# Patient Record
Sex: Male | Born: 1950 | Race: White | Hispanic: No | Marital: Married | Smoking: Never smoker
Health system: Southern US, Community
[De-identification: ages and names within clinical notes are randomized; demographics above are authoritative.]

## PROBLEM LIST (undated history)

## (undated) HISTORY — PX: APPENDECTOMY: SHX54

## (undated) HISTORY — PX: COLOSTOMY: SHX63

## (undated) HISTORY — PX: NEPHRECTOMY: SHX65

## (undated) HISTORY — PX: HERNIA REPAIR: SHX51

---

## 2014-06-24 ENCOUNTER — Emergency Department (HOSPITAL_COMMUNITY)
Admission: EM | Admit: 2014-06-24 | Discharge: 2014-06-24 | Disposition: A | Discharge status: Home / Self Care | Payer: BC Managed Care – PPO | Attending: Emergency Medicine | Admitting: Emergency Medicine

## 2014-06-24 ENCOUNTER — Encounter (HOSPITAL_COMMUNITY): Payer: Self-pay | Admitting: Emergency Medicine

## 2014-06-24 ENCOUNTER — Emergency Department (HOSPITAL_COMMUNITY): Payer: BC Managed Care – PPO

## 2014-06-24 DIAGNOSIS — Z79899 Other long term (current) drug therapy: Secondary | ICD-10-CM | POA: Insufficient documentation

## 2014-06-24 DIAGNOSIS — Y9389 Activity, other specified: Secondary | ICD-10-CM | POA: Diagnosis not present

## 2014-06-24 DIAGNOSIS — Y92410 Unspecified street and highway as the place of occurrence of the external cause: Secondary | ICD-10-CM | POA: Diagnosis not present

## 2014-06-24 DIAGNOSIS — IMO0002 Reserved for concepts with insufficient information to code with codable children: Secondary | ICD-10-CM | POA: Insufficient documentation

## 2014-06-24 DIAGNOSIS — S298XXA Other specified injuries of thorax, initial encounter: Secondary | ICD-10-CM | POA: Diagnosis not present

## 2014-06-24 DIAGNOSIS — S199XXA Unspecified injury of neck, initial encounter: Secondary | ICD-10-CM

## 2014-06-24 DIAGNOSIS — S0993XA Unspecified injury of face, initial encounter: Secondary | ICD-10-CM | POA: Diagnosis not present

## 2014-06-24 DIAGNOSIS — S60222A Contusion of left hand, initial encounter: Secondary | ICD-10-CM

## 2014-06-24 DIAGNOSIS — T2006XA Burn of unspecified degree of forehead and cheek, initial encounter: Secondary | ICD-10-CM | POA: Diagnosis not present

## 2014-06-24 DIAGNOSIS — S60229A Contusion of unspecified hand, initial encounter: Secondary | ICD-10-CM | POA: Diagnosis not present

## 2014-06-24 DIAGNOSIS — S6990XA Unspecified injury of unspecified wrist, hand and finger(s), initial encounter: Secondary | ICD-10-CM | POA: Insufficient documentation

## 2014-06-24 MED ORDER — METHOCARBAMOL 500 MG PO TABS: 500.0000 mg | ORAL_TABLET | Freq: Two times a day (BID) | ORAL | Status: AC | PRN

## 2014-06-24 MED ORDER — NAPROXEN 500 MG PO TABS: 500.0000 mg | ORAL_TABLET | Freq: Two times a day (BID) | ORAL | Status: AC
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
## 2014-06-24 NOTE — Discharge Instructions (Signed)
xrays are normal - no broken bones, see your doctor this week in follow up  Please call your doctor for a followup appointment within 24-48 hours. When you talk to your doctor please let them know that you were seen in the emergency department and have them acquire all of your records so that they can discuss the findings with you and formulate a treatment plan to fully care for your new and ongoing problems.
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
